# Patient Record
Sex: Male | Born: 1990 | Race: White | Hispanic: No | Marital: Married | Smoking: Light tobacco smoker
Health system: Southern US, Community
[De-identification: ages and names within clinical notes are randomized; demographics above are authoritative.]

---
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
## 2012-01-28 ENCOUNTER — Emergency Department (HOSPITAL_COMMUNITY)
Admission: EM | Admit: 2012-01-28 | Discharge: 2012-01-28 | Disposition: A | Discharge status: Home / Self Care | Payer: BC Managed Care – PPO | Attending: Emergency Medicine | Admitting: Emergency Medicine

## 2012-01-28 ENCOUNTER — Emergency Department (HOSPITAL_COMMUNITY): Payer: BC Managed Care – PPO

## 2012-01-28 ENCOUNTER — Encounter (HOSPITAL_COMMUNITY): Payer: Self-pay

## 2012-01-28 DIAGNOSIS — S32009A Unspecified fracture of unspecified lumbar vertebra, initial encounter for closed fracture: Secondary | ICD-10-CM | POA: Insufficient documentation

## 2012-01-28 DIAGNOSIS — M545 Low back pain, unspecified: Secondary | ICD-10-CM | POA: Insufficient documentation

## 2012-01-28 DIAGNOSIS — S32000A Wedge compression fracture of unspecified lumbar vertebra, initial encounter for closed fracture: Secondary | ICD-10-CM

## 2012-01-28 MED ORDER — HYDROMORPHONE HCL PF 1 MG/ML IJ SOLN
1.0000 mg | Freq: Once | INTRAMUSCULAR | Status: AC
  Administered 2012-01-28: 1 mg via INTRAMUSCULAR
  Filled 2012-01-28: qty 1

## 2012-01-28 MED ORDER — OXYCODONE-ACETAMINOPHEN 5-325 MG PO TABS: 1.0000 | ORAL_TABLET | Freq: Four times a day (QID) | ORAL | Status: AC | PRN

## 2012-01-28 NOTE — ED Notes (Signed)
Report give to Magda Paganini, RN in CDU.
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
## 2012-01-28 NOTE — ED Notes (Signed)
Repositioned pt on back and reapplied c-collar per Herbert Seta, PA.

## 2012-01-28 NOTE — Discharge Instructions (Signed)
Back, Compression Fracture  A compression fracture happens when a force is put upon the length of your spine. Slipping and falling on your bottom are examples of such a force. When this happens, sometimes the force is great enough to compress the building blocks (vertebral bodies) of your spine. Although this causes a lot of pain, this can usually be treated at home, unless your caregiver feels hospitalization is needed for pain control.  Your backbone (spinal column) is made up of 24 main vertebral bodies in addition to the sacrum and coccyx (see illustration). These are held together by tough fibrous tissues (ligaments) and by support of your muscles. Nerve roots pass through the openings between the vertebrae. A sudden wrenching move, injury, or a fall may cause a compression fracture of one of the vertebral bodies. This may result in back pain or spread of pain into the belly (abdomen), the buttocks, and down the leg into the foot. Pain may also be created by muscle spasm alone.  Large studies have been undertaken to determine the best possible course of action to help your back following injury and also to prevent future problems. The recommendations are as follows.  FOLLOWING A COMPRESSION FRACTURE:  Do the following only if advised by your caregiver.    If a back brace has been suggested or provided, wear it as directed.   DO NOT stop wearing the back brace unless instructed by your caregiver.   When allowed to return to regular activities, avoid a sedentary life style. Actively exercise. Sporadic weekend binges of tennis, racquetball, water skiing, may actually aggravate or create problems, especially if you are not in condition for that activity.   Avoid sports requiring sudden body movements until you are in condition for them. Swimming and walking are safer activities.   Maintain good posture.   Avoid obesity.   If not already done, you should have a DEXA scan. Based on the results, be treated for  osteoporosis.  FOLLOWING ACUTE (SUDDEN) INJURY:   Only take over-the-counter or prescription medicines for pain, discomfort, or fever as directed by your caregiver.   Use bed rest for only the most extreme acute episode. Prolonged bed rest may aggravate your condition. Ice used for acute conditions is effective. Use a large plastic bag filled with ice. Wrap it in a towel. This also provides excellent pain relief. This may be continuous. Or use it for 30 minutes every 2 hours during acute phase, then as needed. Heat for 30 minutes prior to activities is helpful.   As soon as the acute phase (the time when your back is too painful for you to do normal activities) is over, it is important to resume normal activities and work hardening programs. Back injuries can cause potentially marked changes in lifestyle. So it is important to attack these problems aggressively.   See your caregiver for continued problems. He or she can help or refer you for appropriate exercises, physical therapy and work hardening if needed.   If you are given narcotic medications for your condition, for the next 24 hours DO NOT:   Drive   Operate machinery or power tools.   Sign legal documents.   DO NOT drink alcohol, take sleeping pills or other medications that may interfere with treatment.  If your caregiver has given you a follow-up appointment, it is very important to keep that appointment. Not keeping the appointment could result in a chronic or permanent injury, pain, and disability. If there is any   problem keeping the appointment, you must call back to this facility for assistance.   SEEK IMMEDIATE MEDICAL CARE IF:   You develop numbness, tingling, weakness, or problems with the use of your arms or legs.   You develop severe back pain not relieved with medications.   You have changes in bowel or bladder control.   You have increasing pain in any areas of the body.  Document Released: 08/29/2005 Document Revised: 08/18/2011  Document Reviewed: 04/02/2008  [employer] Patient Information 2012 [employer].
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
## 2012-01-29 NOTE — ED Provider Notes (Signed)
Medical screening examination/treatment/procedure(s) were conducted as a shared visit with non-physician practitioner(s) and myself.  I personally evaluated the patient during the encounter  Pt well appearing, no abdominal tenderness, no focal motor deficits I spoke to dr Jeral Fruit who advised TLSO and f/u as outpatient  Joya Gaskins, MD 01/29/12 1845

## 2018-07-29 ENCOUNTER — Other Ambulatory Visit: Payer: Self-pay

## 2018-07-29 ENCOUNTER — Emergency Department (HOSPITAL_COMMUNITY): Payer: Managed Care, Other (non HMO)

## 2018-07-29 ENCOUNTER — Encounter (HOSPITAL_COMMUNITY): Payer: Self-pay

## 2018-07-29 ENCOUNTER — Emergency Department (HOSPITAL_COMMUNITY)
Admission: EM | Admit: 2018-07-29 | Discharge: 2018-07-29 | Disposition: A | Discharge status: Home / Self Care | Payer: Managed Care, Other (non HMO) | Attending: Emergency Medicine | Admitting: Emergency Medicine

## 2018-07-29 DIAGNOSIS — M549 Dorsalgia, unspecified: Secondary | ICD-10-CM | POA: Diagnosis present

## 2018-07-29 DIAGNOSIS — J189 Pneumonia, unspecified organism: Secondary | ICD-10-CM

## 2018-07-29 DIAGNOSIS — J181 Lobar pneumonia, unspecified organism: Secondary | ICD-10-CM | POA: Diagnosis not present

## 2018-07-29 DIAGNOSIS — F17210 Nicotine dependence, cigarettes, uncomplicated: Secondary | ICD-10-CM | POA: Diagnosis not present

## 2018-07-29 LAB — BASIC METABOLIC PANEL
Anion gap: 11 (ref 5–15)
BUN: 11 mg/dL (ref 6–20)
CO2: 23 mmol/L (ref 22–32)
Calcium: 9.5 mg/dL (ref 8.9–10.3)
Chloride: 100 mmol/L (ref 98–111)
Creatinine, Ser: 0.86 mg/dL (ref 0.61–1.24)
GFR calc Af Amer: >60 mL/min (ref >60)
Glucose, Bld: 94 mg/dL (ref 70–99)
POTASSIUM: 3.7 mmol/L (ref 3.5–5.1)
SODIUM: 134 mmol/L — AB (ref 135–145)

## 2018-07-29 LAB — CBC WITH DIFFERENTIAL/PLATELET
ABS IMMATURE GRANULOCYTES: 0.05 10*3/uL (ref 0.00–0.07)
BASOS PCT: 0 %
Basophils Absolute: 0 10*3/uL (ref 0.0–0.1)
EOS ABS: 0.3 10*3/uL (ref 0.0–0.5)
EOS PCT: 3 %
HCT: 42.1 % (ref 39.0–52.0)
Hemoglobin: 13.5 g/dL (ref 13.0–17.0)
Immature Granulocytes: 1 %
Lymphocytes Relative: 20 %
Lymphs Abs: 2.2 10*3/uL (ref 0.7–4.0)
MCH: 28.4 pg (ref 26.0–34.0)
MCHC: 32.1 g/dL (ref 30.0–36.0)
MCV: 88.4 fL (ref 80.0–100.0)
MONO ABS: 1 10*3/uL (ref 0.1–1.0)
MONOS PCT: 9 %
Neutro Abs: 7.5 10*3/uL (ref 1.7–7.7)
Neutrophils Relative %: 67 %
PLATELETS: 302 10*3/uL (ref 150–400)
RBC: 4.76 MIL/uL (ref 4.22–5.81)
RDW: 12.4 % (ref 11.5–15.5)
WBC: 11 10*3/uL — AB (ref 4.0–10.5)
nRBC: 0 % (ref 0.0–0.2)

## 2018-07-29 MED ORDER — IOPAMIDOL (ISOVUE-370) INJECTION 76%
INTRAVENOUS | Status: AC
  Filled 2018-07-29: qty 100

## 2018-07-29 MED ORDER — IOPAMIDOL (ISOVUE-370) INJECTION 76%
100.0000 mL | Freq: Once | INTRAVENOUS | Status: AC | PRN
  Administered 2018-07-29: 100 mL via INTRAVENOUS

## 2018-07-29 MED ORDER — AMOXICILLIN-POT CLAVULANATE ER 1000-62.5 MG PO TB12: 2.0000 | ORAL_TABLET | Freq: Two times a day (BID) | ORAL | 0 refills | Status: AC

## 2018-07-29 MED ORDER — DOXYCYCLINE HYCLATE 100 MG PO CAPS: 100.0000 mg | ORAL_CAPSULE | Freq: Two times a day (BID) | ORAL | 0 refills | Status: AC

## 2018-07-29 NOTE — ED Triage Notes (Signed)
Pt presents for c/o cough with dark red blood x2 days. Pt also endorses chest tightness when coughing.

## 2018-07-29 NOTE — ED Provider Notes (Signed)
[facility] Provider Note   CSN: [id number] Arrival date & time: 07/29/18  1431     History   Chief Complaint Chief Complaint  Patient presents with  . Hemoptysis    HPI Donald Mendez is a 27 y.o. male presenting for evaluation of hemoptysis.  Patient states he had right-sided back pain began 3 days ago.  It was sharp, but did improve with a hot bath and ibuprofen.  Since then, he has developed a productive cough.  When he takes a deep breath, he reports right-sided chest pain on the front from where his back pain was.  Today he noted that he was coughing up blood.  He says it is not streaked, but clumps of blood.  He denies history of similar.  He denies known fevers or chills.  He denies nausea, vomiting, abdominal pain, urinary symptoms, normal bowel movements.  He denies recent travel, surgeries, immobilization, history of cancer, history of previous DVT/PE, or hormone use.  He has no medical problems, takes medications daily.  He has tried ibuprofen for symptoms, has not tried anything else.  Denies sick contacts.  HPI  History reviewed. No pertinent past medical history.  There are no active problems to display for this patient.   History reviewed. No pertinent surgical history.      Home Medications    Prior to Admission medications   Medication Sig Start Date End Date Taking? Authorizing Provider  amoxicillin-clavulanate (AUGMENTIN XR) 1000-62.5 MG 12 hr tablet Take 2 tablets by mouth 2 (two) times daily for 7 days. 07/29/18 08/05/18  Tahje Borawski, PA-C  doxycycline (VIBRAMYCIN) 100 MG capsule Take 1 capsule (100 mg total) by mouth 2 (two) times daily for 7 days. 07/29/18 08/05/18  Talyah Seder, PA-C    Family History No family history on file.  Social History Social History   Tobacco Use  . Smoking status: Light Tobacco Smoker  . Tobacco comment: states "maybe once a month"  Substance Use Topics  .  Alcohol use: Yes    Comment: ocassionally  . Drug use: No     Allergies   Patient has no known allergies.   Review of Systems Review of Systems  Respiratory: Positive for cough.   Cardiovascular: Positive for chest pain.  All other systems reviewed and are negative.    Physical Exam Updated Vital Signs Ht 5\' 9"  (1.753 m)   Wt 104.3 kg   BMI 33.97 kg/m   Physical Exam  Constitutional: He is oriented to person, place, and time. He appears well-developed and well-nourished. No distress.  Laying comfortably in the bed in no acute distress  HENT:  Head: Normocephalic and atraumatic.  Eyes: Pupils are equal, round, and reactive to light. Conjunctivae and EOM are normal.  Neck: Normal range of motion. Neck supple.  Cardiovascular: Normal rate, regular rhythm and intact distal pulses.  Pulmonary/Chest: Effort normal and breath sounds normal. No respiratory distress. He has no wheezes.  No tenderness palpation of the chest wall.  Speaking in full sentences.  Clear lung sounds in all fields.  Abdominal: Soft. He exhibits no distension and no mass. There is no tenderness. There is no guarding.  Musculoskeletal: Normal range of motion. He exhibits no edema or tenderness.  No leg pain or swelling.  No leg tenderness. No TTP of the back  Neurological: He is alert and oriented to person, place, and time. No sensory deficit.  Skin: Skin is warm and dry. Capillary refill takes less  than 2 seconds.  Psychiatric: He has a normal mood and affect.  Nursing note and vitals reviewed.    ED Treatments / Results  Labs (all labs ordered are listed, but only abnormal results are displayed) Labs Reviewed  CBC WITH DIFFERENTIAL/PLATELET - Abnormal; Notable for the following components:      Result Value   WBC 11.0 (*)    All other components within normal limits  BASIC METABOLIC PANEL - Abnormal; Notable for the following components:   Sodium 134 (*)    All other components within normal  limits    EKG EKG Interpretation  Date/Time:  Sunday July 29 2018 14:41:12 EST Ventricular Rate:  108 PR Interval:    QRS Duration: 89 QT Interval:  302 QTC Calculation: 405 R Axis:   73 Text Interpretation:  Sinus tachycardia No old tracing to compare Confirmed by Coppock, Doreatha Martin [id number]) on 07/29/2018 2:48:20 PM   Radiology Dg Chest 2 View  Result Date: 07/29/2018 CLINICAL DATA:  Chest pain. EXAM: CHEST - 2 VIEW COMPARISON:  None. FINDINGS: Low volume film. Focal opacity posterior right lower lobe is compatible with pneumonia. Left lung clear. The cardiopericardial silhouette is within normal limits for size. The visualized bony structures of the thorax are intact. Telemetry leads overlie the chest. IMPRESSION: Focal airspace disease posterior right lower lobe. This is probably pneumonia. The opacity has a wedge-shaped peripheral configuration on both frontal and lateral projections and pulmonary infarct in the setting of pulmonary embolus would also be a consideration. Electronically Signed   By: Kennith Center M.D.   On: 07/29/2018 15:40   Ct Angio Chest Pe W/cm &/or Wo Cm  Result Date: 07/29/2018 CLINICAL DATA:  Chest tightness and coughing up blood. EXAM: CT ANGIOGRAPHY CHEST WITH CONTRAST TECHNIQUE: Multidetector CT imaging of the chest was performed using the standard protocol during bolus administration of intravenous contrast. Multiplanar CT image reconstructions and MIPs were obtained to evaluate the vascular anatomy. CONTRAST:  ISOVUE-370 IOPAMIDOL (ISOVUE-370) INJECTION 76% COMPARISON:  None. FINDINGS: Cardiovascular: The heart size is normal. No substantial pericardial effusion. No thoracic aortic aneurysm. Opacification of the pulmonary arteries is suboptimal, but no definite filling defect is evident to suggest an acute pulmonary embolus. Mediastinum/Nodes: 14 mm short axis subcarinal lymph node identified on 55/5. Mild lymphadenopathy noted in the right hilum. There  is no axillary lymphadenopathy. Lungs/Pleura: The central tracheobronchial airways are patent. 3.8 x 4.0 x 3.5 cm masslike opacity is identified in the superior segment of the right lower lobe peripherally. This has a surrounding region of ground-glass attenuation. Left lung clear. There is a tiny right pleural effusion. Upper Abdomen: The liver shows diffusely decreased attenuation suggesting steatosis. Musculoskeletal: Bilateral gynecomastia evident. No worrisome lytic or sclerotic osseous abnormality. Review of the MIP images confirms the above findings. IMPRESSION: 1. No definite CT findings of acute pulmonary embolus. 2. Mass-like opacity in the superior segment of the right lower lobe with surrounding ground-glass attenuation and mild mediastinal and hilar lymphadenopathy. Given patient age this is probably pneumonia, but round pneumonia is not typically seen outside of the pediatric population. Close follow-up recommended as neoplasm cannot be excluded. If the patient does not have signs/symptoms of infection, pulmonology consultation may be warranted. Electronically Signed   By: Kennith Center M.D.   On: 07/29/2018 17:27    Procedures Procedures (including critical care time)  Medications Ordered in ED Medications  iopamidol (ISOVUE-370) 76 % injection (has no administration in time range)  iopamidol (ISOVUE-370) 76 % injection 100  mL (100 mLs Intravenous Contrast Given 07/29/18 1624)     Initial Impression / Assessment and Plan / ED Course  I have reviewed the triage vital signs and the nursing notes.  Pertinent labs & imaging results that were available during my care of the patient were reviewed by me and considered in my medical decision making (see chart for details).     Pt presenting for evaluation of cough, hemoptysis, and chest/back pain.  Physical exam shows patient who appears nontoxic.  However, story is concerning for possible pneumonia versus PE versus bronchitis.  Will start  with a chest x-ray, if no pneumonia on chest x-ray, consider ETA to rule out PE.  cxr viewed and interpreted by me, shows possible PNA of R lobe. EBC mildly elevated at 11.0 radiology concerned about PNA abnormal shape and possible infarct 2/2 PE. Will order CTA.   CTA shows abnormal/atypical hernia of the right lobe.  Recommends treating for infection and close monitoring.  If does not improve, consider neoplasm or need for pulmonology consult.  Discussed findings with patient and family.  Discussed treatment with antibiotics and close monitoring.  Patient encouraged to start working now to establish primary care for further follow-up.  At this time, patient appears safe for discharge.  Return precautions given.  Patient states he understands and agrees plan.  Final Clinical Impressions(s) / ED Diagnoses   Final diagnoses:  Community acquired pneumonia of right middle lobe of lung [facility])    ED Discharge Orders         Ordered    amoxicillin-clavulanate (AUGMENTIN XR) 1000-62.5 MG 12 hr tablet  2 times daily     07/29/18 1741    doxycycline (VIBRAMYCIN) 100 MG capsule  2 times daily     07/29/18 1741           Bartley Vuolo, PA-C 07/29/18 1753    Doug Sou, MD 07/30/18 1800

## 2018-07-29 NOTE — Progress Notes (Signed)
Patient for CTPA. Patient had 20 G LAC in place from ED. IV flushed well with saline both by hand and with 4cc/sec for 20 cc saline flush with injector. Patient did not feel any pain while contrast was injecting. Patient received a total of 59 cc Isovue 370. Contrast enhancement on study was good. Patient complained of a pinching feeling in arm after study. Upon exam, there appeared to be a raised area (approximately 2cm)  At IV site. Dr. Kearney Hardover (Radiologist) examined patient. Extravasation felt to be mostly saline, with probably less than 10cc of contrast. IV was removed, area of extravasation was marked and ice pack placed. Pain said site is not painful and exam was clear. Standard Post Extravasation orders placed in Epic and patient's nurse Ryland informed.

## 2018-07-29 NOTE — ED Notes (Signed)
Patient verbalizes understanding of discharge instructions. Opportunity for questioning and answers were provided. Armband removed by staff, pt discharged from ED.  

## 2018-07-29 NOTE — ED Notes (Signed)
Patient transported to CT 

## 2018-07-29 NOTE — Discharge Instructions (Signed)
Antibiotics as prescribed.  Take the entire course, even if your symptoms improve. Use Tylenol or ibuprofen as needed for pain or fever. Make sure you are staying well-hydrated water. It is very important that you follow-up for recheck of this infection.  Start working now to make an appointment sometime in the next 4 weeks.  You may contact the [phone] number in the back of the paperwork to help you set up primary care. Return to the emergency room with any new, worsening, concerning symptoms.

## 2020-03-20 IMAGING — CT CT ANGIO CHEST
3 of 7 series · 18 of 36 positions shown · IV contrast (iopamidol)
Comparison: None.

CLINICAL DATA: Chest tightness and coughing up blood.

EXAM:
CT ANGIOGRAPHY CHEST WITH CONTRAST
TECHNIQUE: Multidetector CT imaging of the chest was performed using the
standard protocol during bolus administration of intravenous
contrast. Multiplanar CT image reconstructions and MIPs were
obtained to evaluate the vascular anatomy.
CONTRAST:  100mL WFBL16-UNX IOPAMIDOL (WFBL16-UNX) INJECTION 76%

[Series 6: pe lung · axial · 0.84mm/px · z∈[+1210,+1270]mm · 2 of 119 slices shown]
[im 30/119  mediastinal]
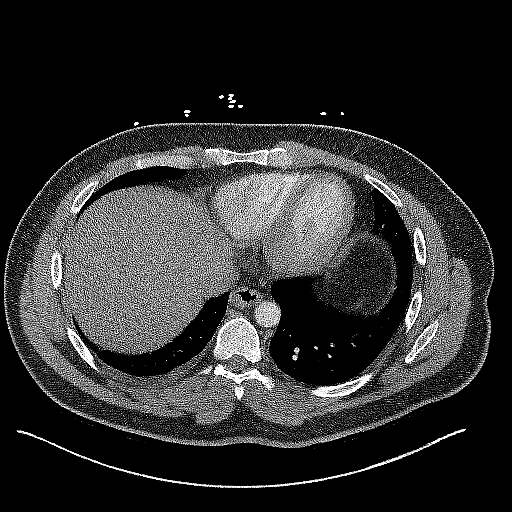
[im 60/119  mediastinal]
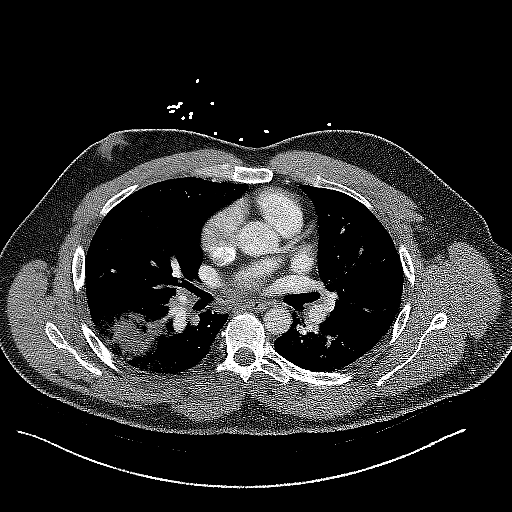

[Series 7: pe thins · axial · 0.84mm/px · z∈[+1152,+1372]mm · 15 of 361 slices shown]
[im 23/361  lung]
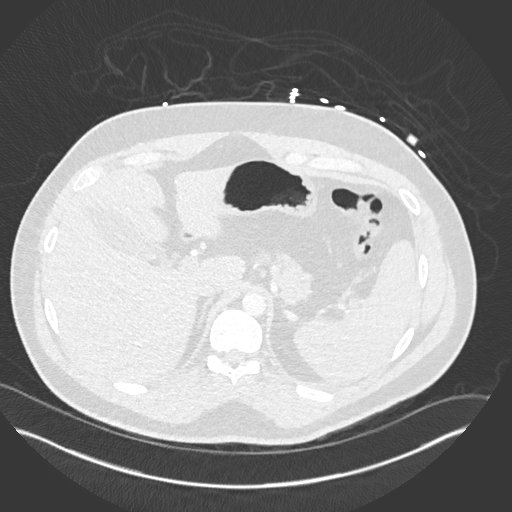
[im 46/361  mediastinal]
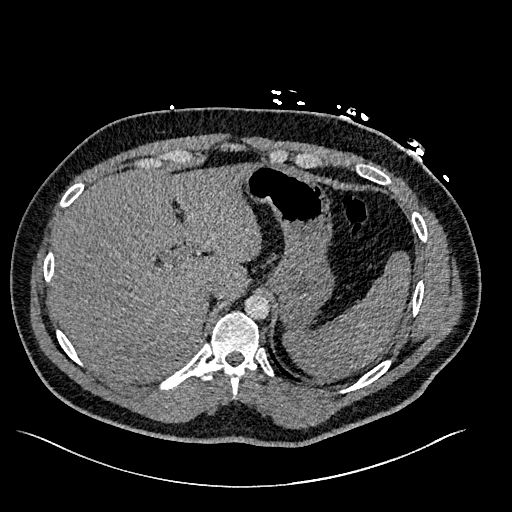
[im 68/361  lung]
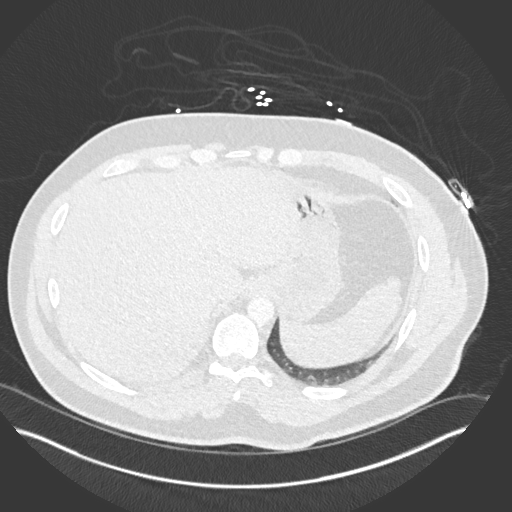
[im 91/361  mediastinal]
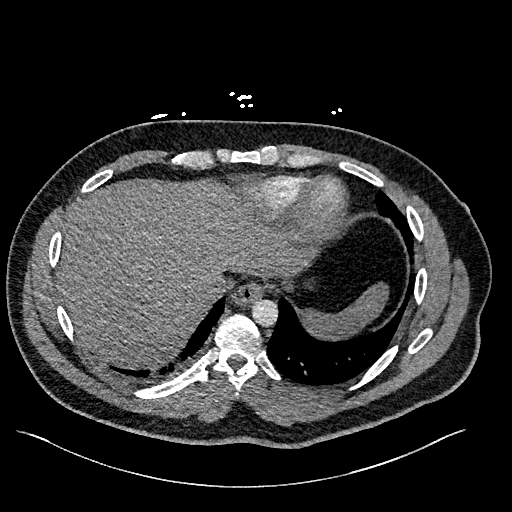
[im 113/361  lung]
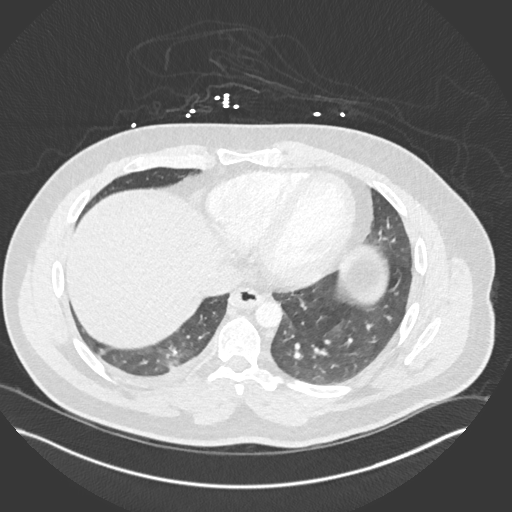
[im 136/361  mediastinal]
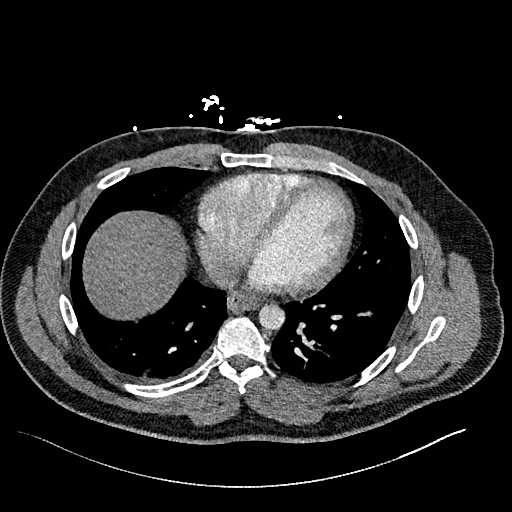
[im 158/361  lung]
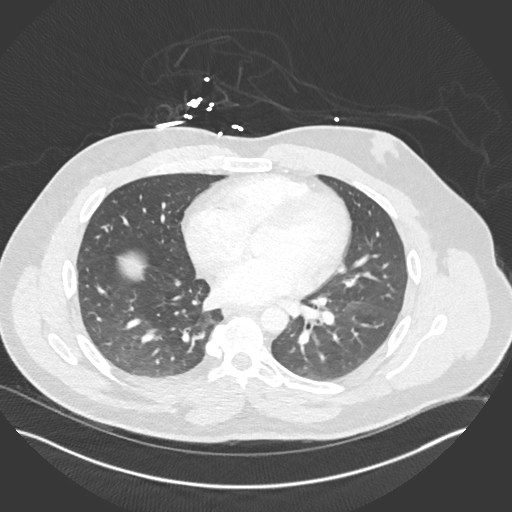
[im 181/361  mediastinal]
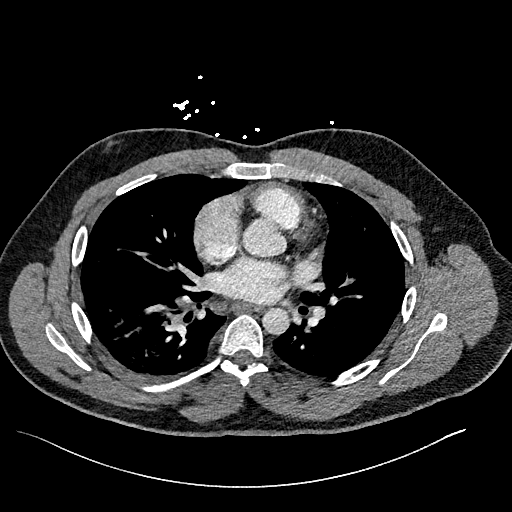
[im 203/361  lung]
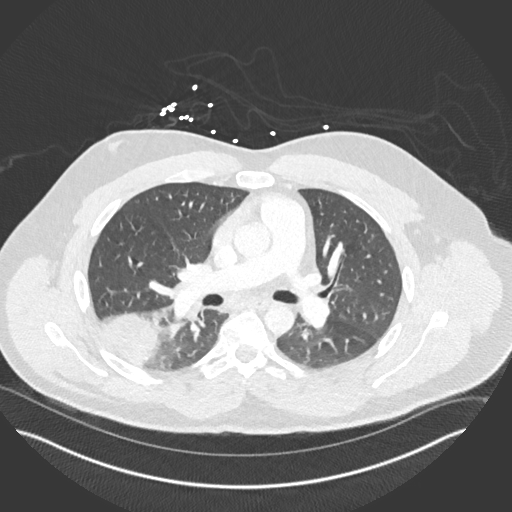
[im 226/361  mediastinal]
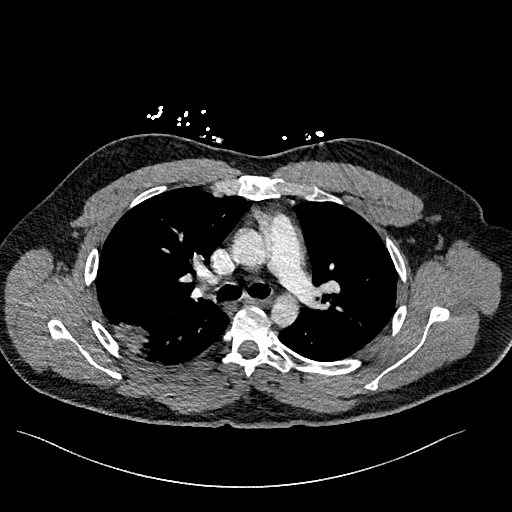
[im 248/361  lung]
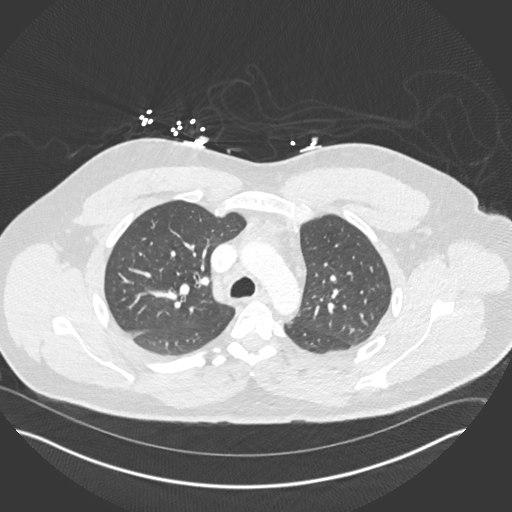
[im 271/361  mediastinal]
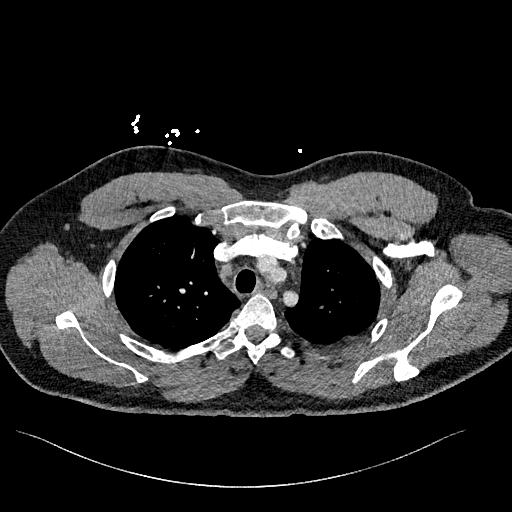
[im 293/361  lung]
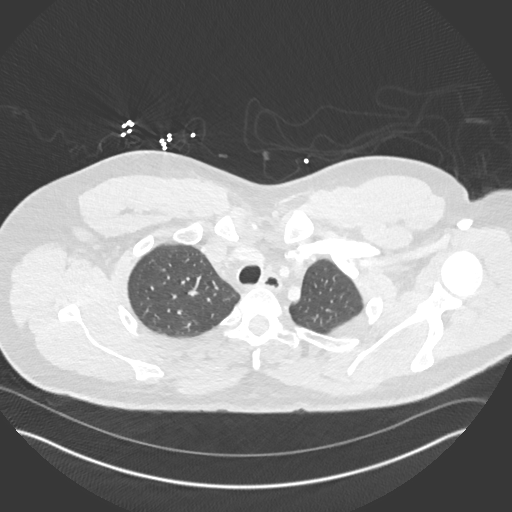
[im 316/361  mediastinal]
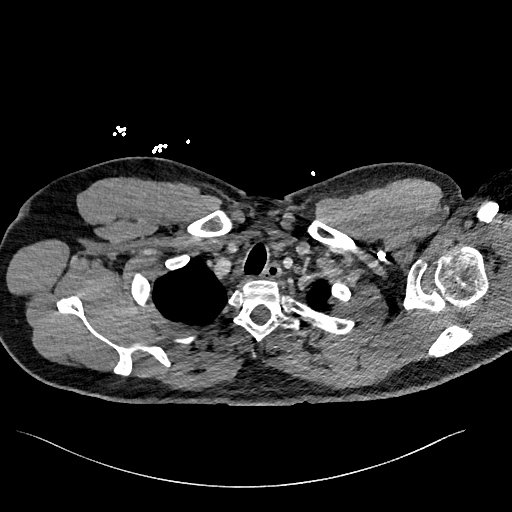
[im 338/361  lung]
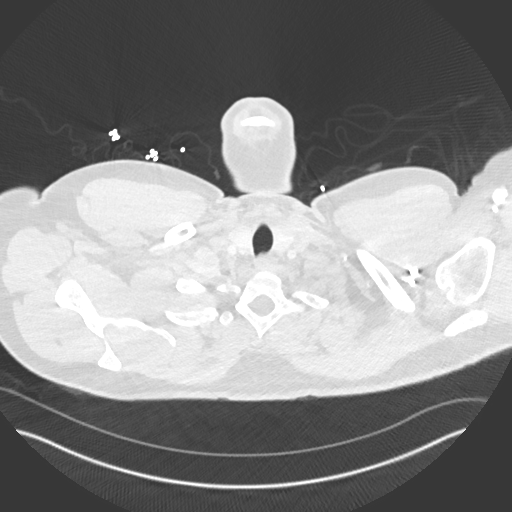

[Series 8: pe 2mm cor · coronal · 0.59mm/px · 1 of 151 slices shown]
[im 76/151  mediastinal]
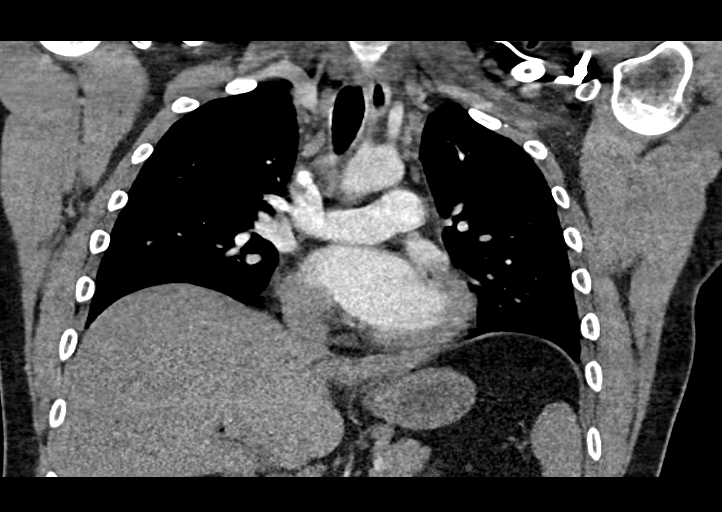

[18 of 36 positions shown; findings below may reference images not displayed]

FINDINGS: Cardiovascular: The heart size is normal. No substantial pericardial
effusion. No thoracic aortic aneurysm. Opacification of the
pulmonary arteries is suboptimal, but no definite filling defect is
evident to suggest an acute pulmonary embolus.

Mediastinum/Nodes: 14 mm short axis subcarinal lymph node identified
on 55/5. Mild lymphadenopathy noted in the right hilum. There is no
axillary lymphadenopathy.

Lungs/Pleura: The central tracheobronchial airways are patent. 3.8 x
4.0 x 3.5 cm masslike opacity is identified in the superior segment
of the right lower lobe peripherally. This has a surrounding region
of ground-glass attenuation. Left lung clear. There is a tiny right
pleural effusion.

Upper Abdomen: The liver shows diffusely decreased attenuation
suggesting steatosis.

Musculoskeletal: Bilateral gynecomastia evident. No worrisome lytic
or sclerotic osseous abnormality.

Review of the MIP images confirms the above findings.
IMPRESSION: 1. No definite CT findings of acute pulmonary embolus.
2. Mass-like opacity in the superior segment of the right lower lobe
with surrounding ground-glass attenuation and mild mediastinal and
hilar lymphadenopathy. Given patient age this is probably pneumonia,
but round pneumonia is not typically seen outside of the pediatric
population. Close follow-up recommended as neoplasm cannot be
excluded. If the patient does not have signs/symptoms of infection,
pulmonology consultation may be warranted.

## 2020-03-20 IMAGING — CR DG CHEST 2V
2 series · 2 of 2 positions shown · non-contrast
Comparison: None.

CLINICAL DATA: Chest pain.

EXAM:
CHEST - 2 VIEW

[chest pa]
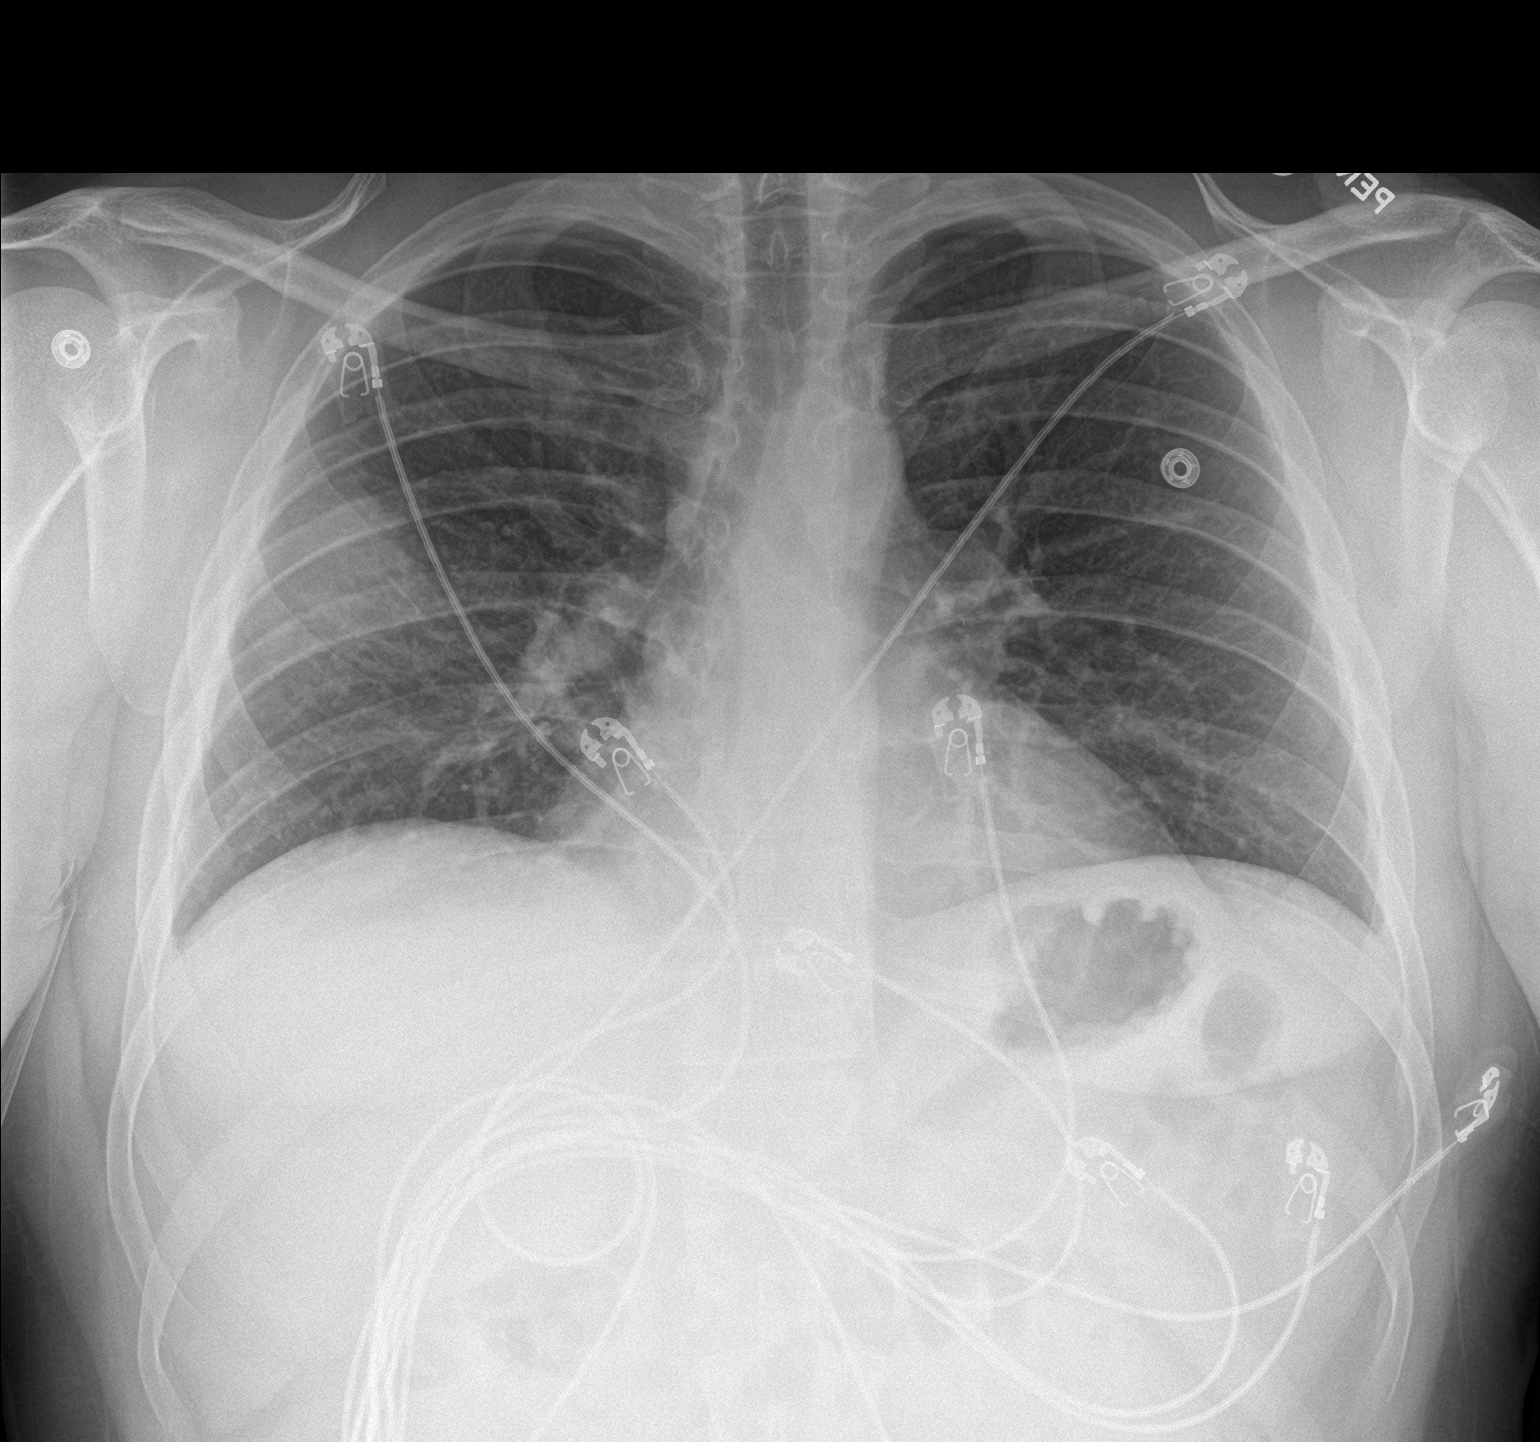

[chest lat]
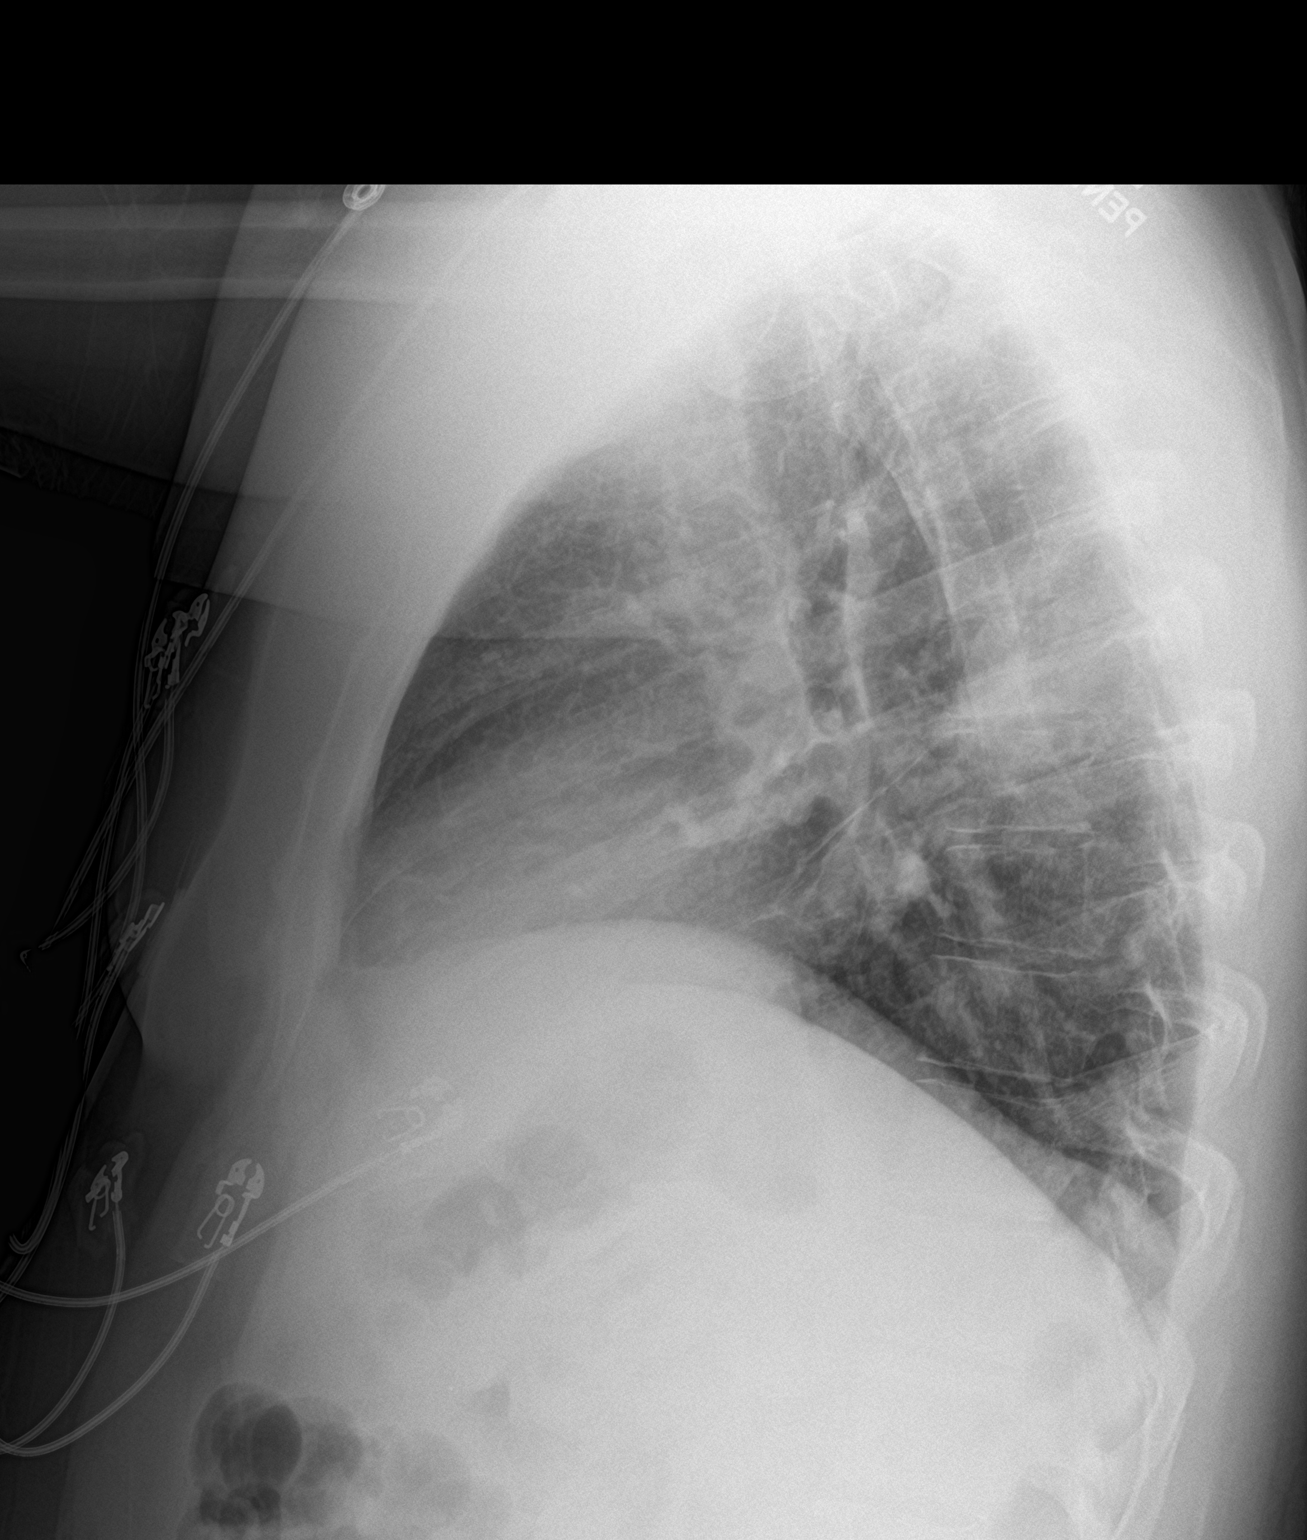

[2 of 2 positions shown; findings below may reference images not displayed]

FINDINGS: Low volume film. Focal opacity posterior right lower lobe is
compatible with pneumonia. Left lung clear. The cardiopericardial
silhouette is within normal limits for size. The visualized bony
structures of the thorax are intact. Telemetry leads overlie the
chest.
IMPRESSION: Focal airspace disease posterior right lower lobe. This is probably
pneumonia. The opacity has a wedge-shaped peripheral configuration
on both frontal and lateral projections and pulmonary infarct in the
setting of pulmonary embolus would also be a consideration..
